# Patient Record
Sex: Male | Born: 1935 | Race: Black or African American | Hispanic: No | Marital: Married
Health system: Southern US, Community
[De-identification: ages and names within clinical notes are randomized; demographics above are authoritative.]

---

## 2003-10-30 ENCOUNTER — Ambulatory Visit: Payer: Self-pay | Admitting: Internal Medicine

## 2003-12-22 ENCOUNTER — Ambulatory Visit: Payer: Self-pay | Admitting: Internal Medicine

## 2004-04-30 ENCOUNTER — Encounter: Payer: Self-pay | Admitting: Internal Medicine

## 2004-05-06 ENCOUNTER — Encounter: Payer: Self-pay | Admitting: Internal Medicine

## 2004-06-06 ENCOUNTER — Encounter: Payer: Self-pay | Admitting: Internal Medicine

## 2004-07-06 ENCOUNTER — Encounter: Payer: Self-pay | Admitting: Internal Medicine

## 2004-08-27 ENCOUNTER — Encounter: Payer: Self-pay | Admitting: Internal Medicine

## 2004-09-06 ENCOUNTER — Encounter: Payer: Self-pay | Admitting: Internal Medicine

## 2004-10-06 ENCOUNTER — Encounter: Payer: Self-pay | Admitting: Internal Medicine

## 2004-10-06 ENCOUNTER — Other Ambulatory Visit: Payer: Self-pay

## 2004-10-06 ENCOUNTER — Inpatient Hospital Stay: Payer: Self-pay | Admitting: Internal Medicine

## 2005-07-03 ENCOUNTER — Ambulatory Visit: Payer: Self-pay | Admitting: Internal Medicine

## 2005-07-11 ENCOUNTER — Ambulatory Visit: Payer: Self-pay | Admitting: Internal Medicine

## 2005-09-01 ENCOUNTER — Encounter: Payer: Self-pay | Admitting: Psychiatry

## 2005-09-08 ENCOUNTER — Ambulatory Visit: Payer: Self-pay | Admitting: Neurology

## 2005-09-11 ENCOUNTER — Encounter: Payer: Self-pay | Admitting: Psychiatry

## 2005-10-06 ENCOUNTER — Encounter: Payer: Self-pay | Admitting: Psychiatry

## 2005-10-08 ENCOUNTER — Ambulatory Visit: Payer: Self-pay | Admitting: Neurology

## 2005-11-06 ENCOUNTER — Encounter: Payer: Self-pay | Admitting: Psychiatry

## 2007-01-15 ENCOUNTER — Inpatient Hospital Stay: Payer: Self-pay | Admitting: Internal Medicine

## 2007-01-15 ENCOUNTER — Other Ambulatory Visit: Payer: Self-pay

## 2007-04-18 ENCOUNTER — Other Ambulatory Visit: Payer: Self-pay

## 2007-04-18 ENCOUNTER — Inpatient Hospital Stay: Payer: Self-pay | Admitting: Internal Medicine

## 2007-06-30 ENCOUNTER — Inpatient Hospital Stay: Payer: Self-pay | Admitting: Internal Medicine

## 2007-06-30 ENCOUNTER — Other Ambulatory Visit: Payer: Self-pay

## 2008-09-06 ENCOUNTER — Emergency Department: Payer: Self-pay | Admitting: Emergency Medicine

## 2010-11-01 ENCOUNTER — Inpatient Hospital Stay: Payer: Self-pay | Admitting: Student

## 2013-03-31 ENCOUNTER — Emergency Department: Payer: Self-pay | Admitting: Emergency Medicine

## 2013-03-31 LAB — URINALYSIS, COMPLETE
Bilirubin,UR: NEGATIVE
GLUCOSE, UR: NEGATIVE mg/dL (ref 0–75)
KETONE: NEGATIVE
NITRITE: NEGATIVE
PH: 5 (ref 4.5–8.0)
Protein: 100
SPECIFIC GRAVITY: 1.013 (ref 1.003–1.030)
Squamous Epithelial: NONE SEEN
WBC UR: 9 /HPF (ref 0–5)

## 2013-03-31 LAB — BASIC METABOLIC PANEL
Anion Gap: 5 — ABNORMAL LOW (ref 7–16)
BUN: 32 mg/dL — ABNORMAL HIGH (ref 7–18)
CREATININE: 1.64 mg/dL — AB (ref 0.60–1.30)
Calcium, Total: 8.9 mg/dL (ref 8.5–10.1)
Chloride: 107 mmol/L (ref 98–107)
Co2: 24 mmol/L (ref 21–32)
EGFR (African American): 46 — ABNORMAL LOW
GFR CALC NON AF AMER: 39 — AB
Glucose: 89 mg/dL (ref 65–99)
Osmolality: 278 (ref 275–301)
Potassium: 5 mmol/L (ref 3.5–5.1)
Sodium: 136 mmol/L (ref 136–145)

## 2013-03-31 LAB — CBC
HCT: 34 % — AB (ref 40.0–52.0)
HGB: 10.7 g/dL — ABNORMAL LOW (ref 13.0–18.0)
MCH: 27.3 pg (ref 26.0–34.0)
MCHC: 31.4 g/dL — ABNORMAL LOW (ref 32.0–36.0)
MCV: 87 fL (ref 80–100)
Platelet: 220 10*3/uL (ref 150–440)
RBC: 3.91 10*6/uL — AB (ref 4.40–5.90)
RDW: 17.2 % — ABNORMAL HIGH (ref 11.5–14.5)
WBC: 9.4 10*3/uL (ref 3.8–10.6)

## 2013-03-31 LAB — TROPONIN I

## 2013-12-06 ENCOUNTER — Ambulatory Visit
Admit: 2013-12-06 | Disposition: A | Discharge status: Home / Self Care | Payer: Self-pay | Attending: Nurse Practitioner | Admitting: Nurse Practitioner

## 2014-04-04 IMAGING — CT CT HEAD WITHOUT CONTRAST
2 of 6 series · 11 of 47 positions shown, 13 images · non-contrast
Comparison: CT HEAD W/O CM dated 09/06/2008

CLINICAL DATA: Status post fall from wheelchair, soft tissue
swelling over the left facial region

EXAM:
CT HEAD WITHOUT CONTRAST
CT CERVICAL SPINE WITHOUT CONTRAST
TECHNIQUE: Multidetector CT imaging of the head and cervical spine was
performed following the standard protocol without intravenous
contrast. Multiplanar CT image reconstructions of the cervical spine
were also generated.

[Series 7: coronal bone · coronal · 0.20mm/px · 3 of 46 slices shown]
[im 16/46  brain]
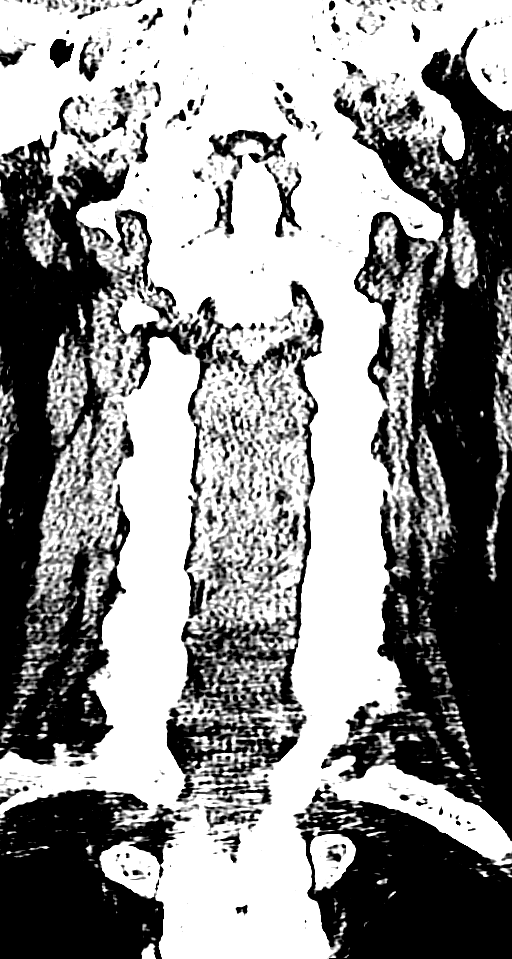
[im 21/46  brain]
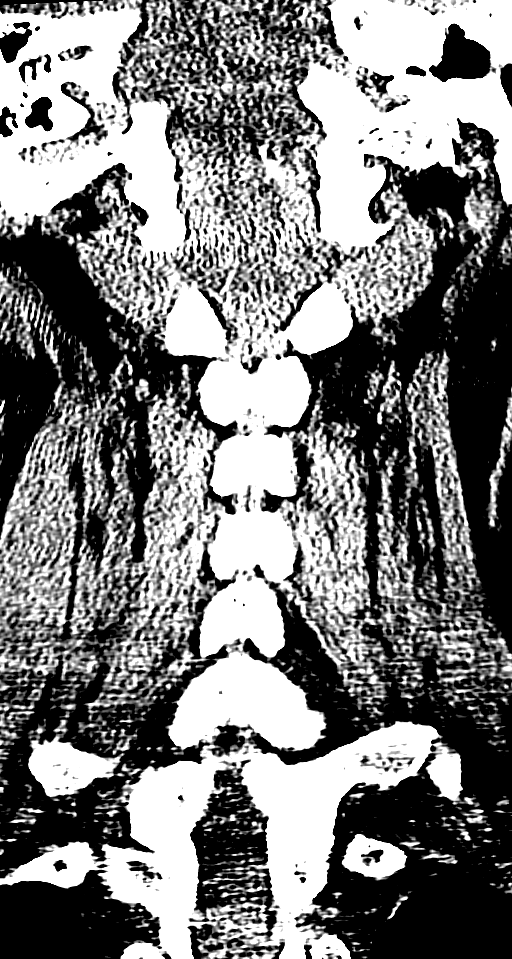
[im 26/46  brain]
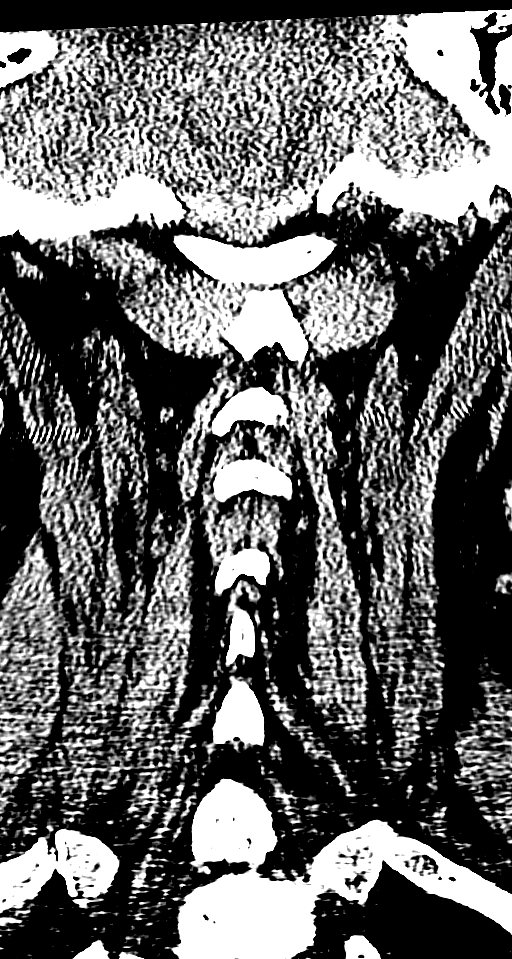

[Series 8: axial · axial · 0.17mm/px · z∈[+95,+223]mm · 8 of 93 slices shown, 10 images]
[im 11/93  brain]
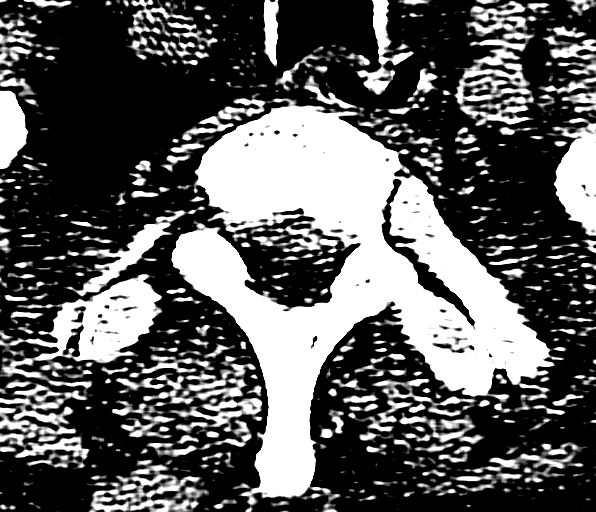
[im 11/93  bone]
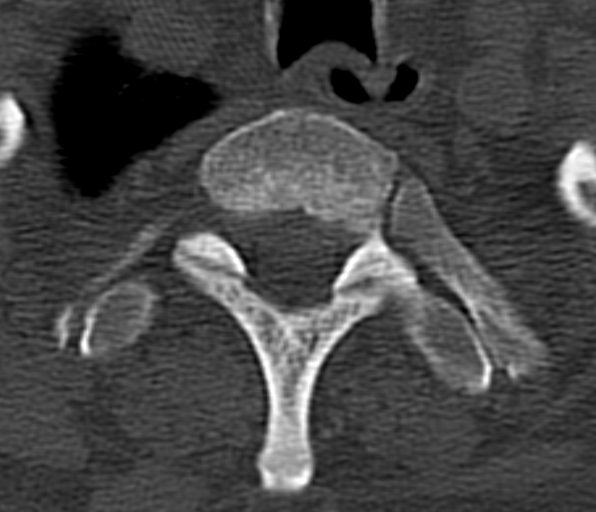
[im 21/93  brain]
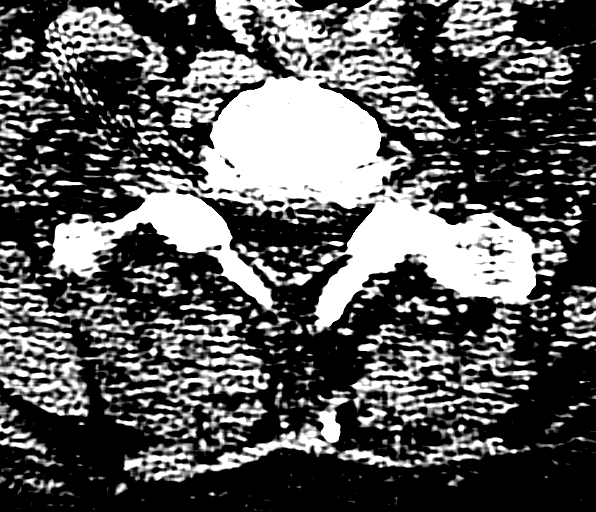
[im 31/93  brain]
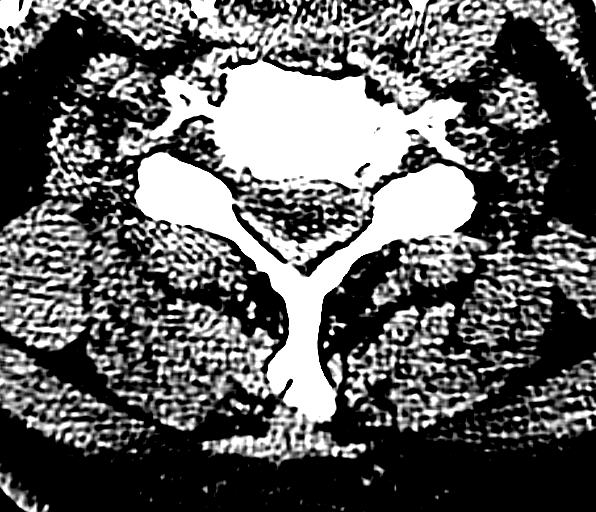
[im 41/93  brain]
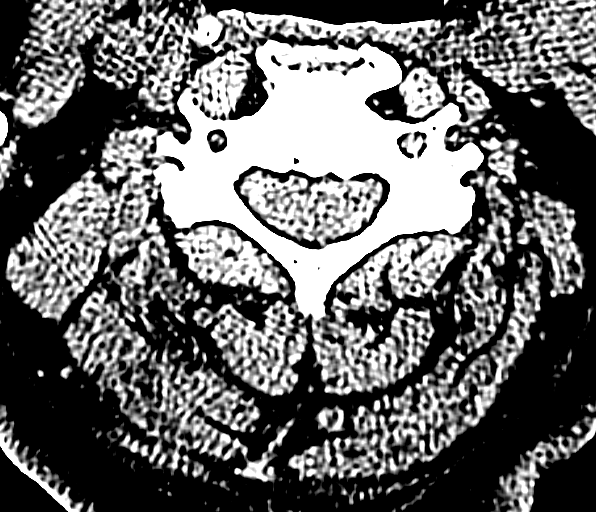
[im 52/93  brain]
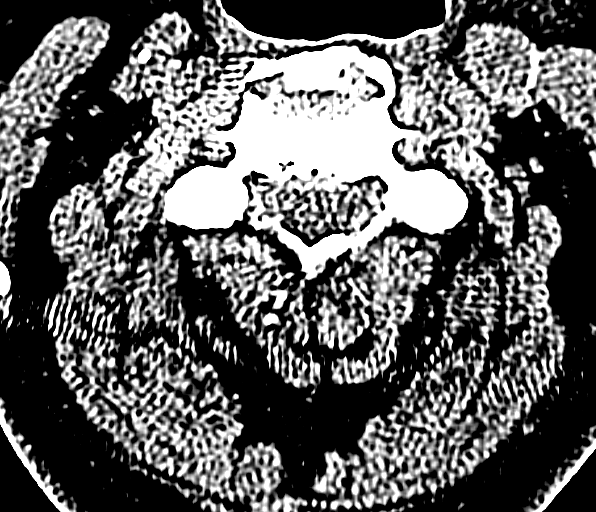
[im 52/93  bone]
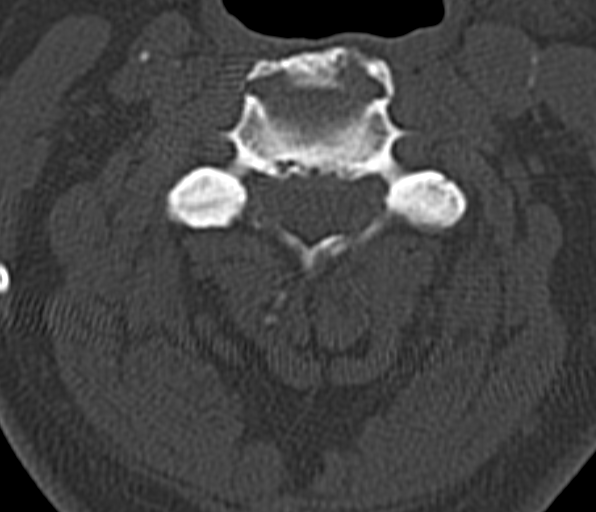
[im 62/93  brain]
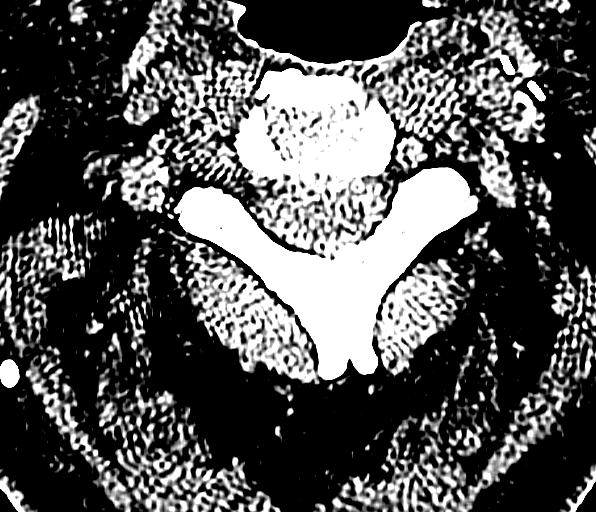
[im 72/93  brain]
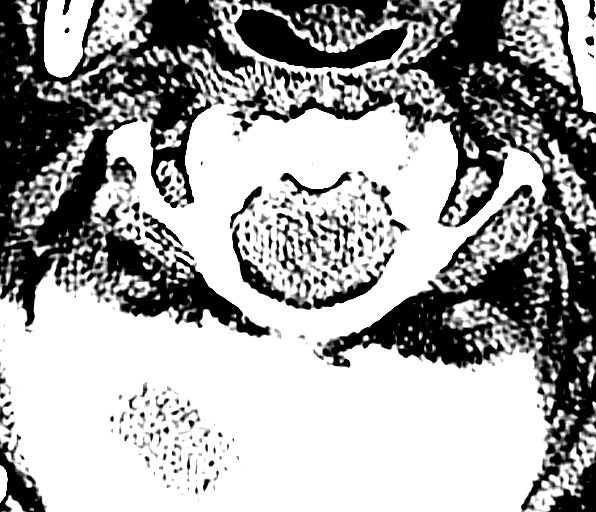
[im 82/93  brain]
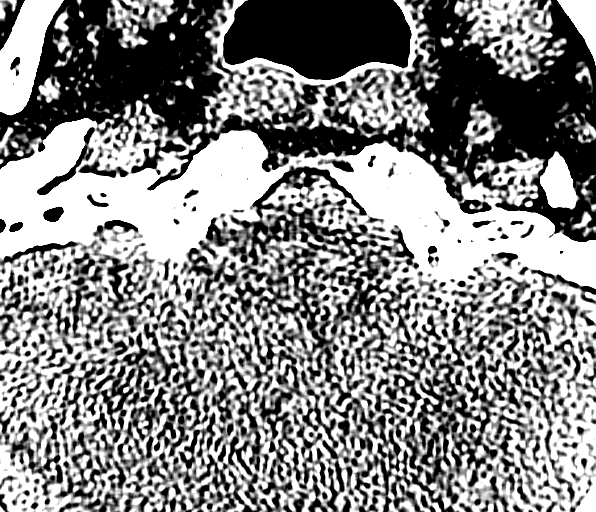

[11 of 47 positions shown; findings below may reference images not displayed]

FINDINGS: CT HEAD FINDINGS

There is a large area of encephalomalacia in the right frontal lobe
with evidence of previous craniectomy. There is a ventricular shunt
tube present which enters via the right posterior parietal bone and
crosses the midline to terminate in the left frontal lobe. There is
mild ventriculomegaly which is chronic. There is no shift of the
midline. There is no evidence of an acute intracranial hemorrhage.
The cerebellum and brainstem exhibit no acute abnormalities. There
is likely an old lacunar infarction in the right cerebellar
hemisphere. At bone window settings there is no acute skull
fracture. There is a cephalohematoma over the left temporoparietal
bone. All

CT CERVICAL SPINE FINDINGS

The cervical vertebral bodies are preserved in height. There is
degenerative disc space narrowing at C4-5 and C5-6 and C6-7. There
are large anterior endplate osteophytes at all cervical levels.
There is no evidence of a perched facet nor of a facet or spinous
process fracture. The prevertebral soft tissue spaces appear normal.
The odontoid is intact and the lateral masses of C1 align normally
with those of C2. The bony ring at each cervical level is intact.
The observed portions of the first and second ribs appear normal.
The pulmonary apices exhibit no acute abnormalities. There is a
hypodense nodule measuring 12 mm in greatest dimension in the upper
pole of the left thyroid lobe.
IMPRESSION: 1. There are extensive chronic changes within the brain with
presence of a ventricular shunt. There is no evidence of an acute
intracranial hemorrhage nor other acute intracranial abnormality.
2. There is no evidence of an acute cervical spine fracture nor
dislocation. There is degenerative disc change at all cervical
levels.

## 2014-04-07 ENCOUNTER — Ambulatory Visit: Payer: Self-pay | Admitting: Nurse Practitioner

## 2014-08-07 DEATH — deceased

## 2019-07-26 NOTE — Progress Notes (Signed)
This encounter was created in error - please disregard.
